# Patient Record
Sex: Female | Born: 1950 | Race: White | Hispanic: No | Smoking: Never smoker
Health system: Southern US, Community
[De-identification: ages and names within clinical notes are randomized; demographics above are authoritative.]

## PROBLEM LIST (undated history)

## (undated) DIAGNOSIS — K219 Gastro-esophageal reflux disease without esophagitis: Secondary | ICD-10-CM

## (undated) DIAGNOSIS — F32A Depression, unspecified: Secondary | ICD-10-CM

## (undated) DIAGNOSIS — F329 Major depressive disorder, single episode, unspecified: Secondary | ICD-10-CM

## (undated) DIAGNOSIS — F419 Anxiety disorder, unspecified: Secondary | ICD-10-CM

## (undated) DIAGNOSIS — R112 Nausea with vomiting, unspecified: Secondary | ICD-10-CM

## (undated) DIAGNOSIS — I10 Essential (primary) hypertension: Secondary | ICD-10-CM

## (undated) DIAGNOSIS — Z9889 Other specified postprocedural states: Secondary | ICD-10-CM

## (undated) HISTORY — PX: NEUROMA SURGERY: SHX722

## (undated) HISTORY — PX: BREAST SURGERY: SHX581

## (undated) HISTORY — PX: EYE SURGERY: SHX253

---

## 2017-12-30 ENCOUNTER — Other Ambulatory Visit: Payer: Self-pay | Admitting: Orthopedic Surgery

## 2018-01-03 ENCOUNTER — Other Ambulatory Visit: Payer: Self-pay | Admitting: Orthopedic Surgery

## 2018-01-03 DIAGNOSIS — M19011 Primary osteoarthritis, right shoulder: Secondary | ICD-10-CM

## 2018-01-05 ENCOUNTER — Ambulatory Visit
Admission: RE | Admit: 2018-01-05 | Discharge: 2018-01-05 | Disposition: A | Discharge status: Home / Self Care | Payer: Medicare Other | Source: Ambulatory Visit | Attending: Orthopedic Surgery | Admitting: Orthopedic Surgery

## 2018-01-05 DIAGNOSIS — M19011 Primary osteoarthritis, right shoulder: Secondary | ICD-10-CM

## 2018-01-24 NOTE — Pre-Procedure Instructions (Signed)
Verdis Primenn Bilodeau  01/24/2018      [facility] [id number] - [place] - [address] [place] [id number] Phone: [phone] Fax: [phone]    Your procedure is scheduled on February 14  Report to [facility] Admitting at 0530 A.M.  Call this number if you have problems the morning of surgery:  [phone]   Remember:  Do not eat food or drink liquids after midnight.  Continue all medications as directed by your physician except follow these medication instructions before surgery below   Take these medicines the morning of surgery with A SIP OF WATER  amLODipine (NORVASC) atenolol (TENORMIN) buPROPion (WELLBUTRIN  busPIRone (BUSPAR) cetirizine (ZYRTEC) MYRBETRIQ omeprazole (PRILOSEC) traMADol-acetaminophen (ULTRACET)  7 days prior to surgery STOP taking any Aspirin(unless otherwise instructed by your surgeon), Aleve, Naproxen, Ibuprofen, Motrin, Advil, Goody's, BC's, all herbal medications, fish oil, and all vitamins     Do not wear jewelry, make-up or nail polish.  Do not wear lotions, powders, or perfumes, or deodorant.  Do not shave 48 hours prior to surgery.    Do not bring valuables to the hospital.  [facility] is not responsible for any belongings or valuables.  Contacts, dentures or bridgework may not be worn into surgery.  Leave your suitcase in the car.  After surgery it may be brought to your room.  For patients admitted to the hospital, discharge time will be determined by your treatment team.  Patients discharged the day of surgery will not be allowed to drive home.    Special instructions:   [place]- Preparing For Surgery  Before surgery, you can play an important role. Because skin is not sterile, your skin needs to be as free of germs as possible. You can reduce the number of germs on your skin by washing with CHG (chlorahexidine gluconate) Soap before surgery.  CHG is an  antiseptic cleaner which kills germs and bonds with the skin to continue killing germs even after washing.  Please do not use if you have an allergy to CHG or antibacterial soaps. If your skin becomes reddened/irritated stop using the CHG.  Do not shave (including legs and underarms) for at least 48 hours prior to first CHG shower. It is OK to shave your face.  Please follow these instructions carefully.   1. Shower the NIGHT BEFORE SURGERY and the MORNING OF SURGERY with CHG.   2. If you chose to wash your hair, wash your hair first as usual with your normal shampoo.  3. After you shampoo, rinse your hair and body thoroughly to remove the shampoo.  4. Use CHG as you would any other liquid soap. You can apply CHG directly to the skin and wash gently with a scrungie or a clean washcloth.   5. Apply the CHG Soap to your body ONLY FROM THE NECK DOWN.  Do not use on open wounds or open sores. Avoid contact with your eyes, ears, mouth and genitals (private parts). Wash Face and genitals (private parts)  with your normal soap.  6. Wash thoroughly, paying special attention to the area where your surgery will be performed.  7. Thoroughly rinse your body with warm water from the neck down.  8. DO NOT shower/wash with your normal soap after using and rinsing off the CHG Soap.  9. Pat yourself dry with a CLEAN TOWEL.  10. Wear CLEAN PAJAMAS to bed the  night before surgery, wear comfortable clothes the morning of surgery  11. Place CLEAN SHEETS on your bed the night of your first shower and DO NOT SLEEP WITH PETS.    Day of Surgery: Do not apply any deodorants/lotions. Please wear clean clothes to the hospital/surgery center.      Please read over the following fact sheets that you were given.

## 2018-01-25 ENCOUNTER — Encounter (HOSPITAL_COMMUNITY): Payer: Self-pay | Admitting: *Deleted

## 2018-01-25 ENCOUNTER — Encounter (HOSPITAL_COMMUNITY)
Admission: RE | Admit: 2018-01-25 | Discharge: 2018-01-25 | Disposition: A | Discharge status: Home / Self Care | Payer: Medicare Other | Source: Ambulatory Visit | Attending: Orthopedic Surgery | Admitting: Orthopedic Surgery

## 2018-01-25 DIAGNOSIS — Z01818 Encounter for other preprocedural examination: Secondary | ICD-10-CM

## 2018-01-25 HISTORY — DX: Depression, unspecified: F32.A

## 2018-01-25 HISTORY — DX: Gastro-esophageal reflux disease without esophagitis: K21.9

## 2018-01-25 HISTORY — DX: Essential (primary) hypertension: I10

## 2018-01-25 HISTORY — DX: Major depressive disorder, single episode, unspecified: F32.9

## 2018-01-25 HISTORY — DX: Anxiety disorder, unspecified: F41.9

## 2018-01-25 HISTORY — DX: Nausea with vomiting, unspecified: Z98.890

## 2018-01-25 HISTORY — DX: Other specified postprocedural states: R11.2

## 2018-01-25 LAB — URINALYSIS, ROUTINE W REFLEX MICROSCOPIC
BILIRUBIN URINE: NEGATIVE
Glucose, UA: NEGATIVE mg/dL
Hgb urine dipstick: NEGATIVE
Ketones, ur: NEGATIVE mg/dL
LEUKOCYTES UA: NEGATIVE
NITRITE: NEGATIVE
PH: 7 (ref 5.0–8.0)
Protein, ur: NEGATIVE mg/dL
SPECIFIC GRAVITY, URINE: 1.005 (ref 1.005–1.030)

## 2018-01-25 LAB — COMPREHENSIVE METABOLIC PANEL
ALBUMIN: 4.1 g/dL (ref 3.5–5.0)
ALT: 25 U/L (ref 14–54)
AST: 41 U/L (ref 15–41)
Alkaline Phosphatase: 64 U/L (ref 38–126)
Anion gap: 16 — ABNORMAL HIGH (ref 5–15)
BILIRUBIN TOTAL: 1.4 mg/dL — AB (ref 0.3–1.2)
BUN: 15 mg/dL (ref 6–20)
CALCIUM: 9.5 mg/dL (ref 8.9–10.3)
CO2: 18 mmol/L — ABNORMAL LOW (ref 22–32)
CREATININE: 0.72 mg/dL (ref 0.44–1.00)
Chloride: 100 mmol/L — ABNORMAL LOW (ref 101–111)
GFR calc Af Amer: >60 mL/min (ref >60)
GLUCOSE: 134 mg/dL — AB (ref 65–99)
Potassium: 4.6 mmol/L (ref 3.5–5.1)
Sodium: 134 mmol/L — ABNORMAL LOW (ref 135–145)
TOTAL PROTEIN: 7.4 g/dL (ref 6.5–8.1)

## 2018-01-25 LAB — TYPE AND SCREEN
ABO/RH(D): A POS
ANTIBODY SCREEN: NEGATIVE

## 2018-01-25 LAB — PROTIME-INR
INR: 0.85
PROTHROMBIN TIME: 11.6 s (ref 11.4–15.2)

## 2018-01-25 LAB — ABO/RH: ABO/RH(D): A POS

## 2018-01-25 LAB — CBC WITH DIFFERENTIAL/PLATELET
BASOS ABS: 0 10*3/uL (ref 0.0–0.1)
BASOS PCT: 1 %
Eosinophils Absolute: 0.2 10*3/uL (ref 0.0–0.7)
Eosinophils Relative: 2 %
HEMATOCRIT: 39.4 % (ref 36.0–46.0)
Hemoglobin: 13.4 g/dL (ref 12.0–15.0)
LYMPHS PCT: 30 %
Lymphs Abs: 2.3 10*3/uL (ref 0.7–4.0)
MCH: 32.5 pg (ref 26.0–34.0)
MCHC: 34 g/dL (ref 30.0–36.0)
MCV: 95.6 fL (ref 78.0–100.0)
MONO ABS: 0.6 10*3/uL (ref 0.1–1.0)
Monocytes Relative: 8 %
NEUTROS ABS: 4.5 10*3/uL (ref 1.7–7.7)
NEUTROS PCT: 59 %
Platelets: 275 10*3/uL (ref 150–400)
RBC: 4.12 MIL/uL (ref 3.87–5.11)
RDW: 13.1 % (ref 11.5–15.5)
WBC: 7.6 10*3/uL (ref 4.0–10.5)

## 2018-01-25 LAB — SURGICAL PCR SCREEN
MRSA, PCR: NEGATIVE
Staphylococcus aureus: NEGATIVE

## 2018-01-25 LAB — APTT: APTT: 28 s (ref 24–36)

## 2018-01-26 ENCOUNTER — Encounter (HOSPITAL_COMMUNITY): Payer: Self-pay | Admitting: Anesthesiology

## 2018-01-26 MED ORDER — CEFAZOLIN SODIUM-DEXTROSE 2-4 GM/100ML-% IV SOLN
2.0000 g | INTRAVENOUS | Status: AC
  Administered 2018-01-27: 2 g via INTRAVENOUS
  Filled 2018-01-26 (×2): qty 100

## 2018-01-26 NOTE — Anesthesia Preprocedure Evaluation (Addendum)
Anesthesia Evaluation  Patient identified by MRN, date of birth, ID band Patient awake    Reviewed: Allergy & Precautions, NPO status , Patient's Chart, lab work & pertinent test results, reviewed documented beta blocker date and time   History of Anesthesia Complications (+) PONV and history of anesthetic complications  Airway Mallampati: III  TM Distance: >3 FB Neck ROM: Full    Dental  (+) Teeth Intact, Caps   Pulmonary neg pulmonary ROS,    Pulmonary exam normal breath sounds clear to auscultation       Cardiovascular hypertension, Pt. on home beta blockers and Pt. on medications Normal cardiovascular exam Rhythm:Regular Rate:Normal     Neuro/Psych PSYCHIATRIC DISORDERS Anxiety Depression negative neurological ROS     GI/Hepatic Neg liver ROS, GERD  Medicated and Controlled,  Endo/Other  Hyperlipidemia   Renal/GU negative Renal ROS  negative genitourinary   Musculoskeletal  (+) Arthritis , Osteoarthritis,  OA right shoulder   Abdominal   Peds  Hematology negative hematology ROS (+)   Anesthesia Other Findings   Reproductive/Obstetrics                            Anesthesia Physical Anesthesia Plan  ASA: II  Anesthesia Plan: General   Post-op Pain Management:  Regional for Post-op pain   Induction: Intravenous  PONV Risk Score and Plan: 4 or greater and Scopolamine patch - Pre-op, Ondansetron, Dexamethasone and Treatment may vary due to age or medical condition  Airway Management Planned: Oral ETT  Additional Equipment:   Intra-op Plan:   Post-operative Plan: Extubation in OR  Informed Consent: I have reviewed the patients History and Physical, chart, labs and discussed the procedure including the risks, benefits and alternatives for the proposed anesthesia with the patient or authorized representative who has indicated his/her understanding and acceptance.   Dental  advisory given  Plan Discussed with: CRNA, Anesthesiologist and Surgeon  Anesthesia Plan Comments:        Anesthesia Quick Evaluation

## 2018-01-27 ENCOUNTER — Inpatient Hospital Stay (HOSPITAL_COMMUNITY): Payer: Medicare Other | Admitting: Anesthesiology

## 2018-01-27 ENCOUNTER — Encounter (HOSPITAL_COMMUNITY)
Admission: RE | Disposition: A | Discharge status: Home / Self Care | Payer: Self-pay | Source: Ambulatory Visit | Attending: Orthopedic Surgery

## 2018-01-27 ENCOUNTER — Other Ambulatory Visit: Payer: Self-pay

## 2018-01-27 ENCOUNTER — Encounter (HOSPITAL_COMMUNITY): Payer: Self-pay | Admitting: Certified Registered"

## 2018-01-27 ENCOUNTER — Inpatient Hospital Stay (HOSPITAL_COMMUNITY): Payer: Medicare Other

## 2018-01-27 ENCOUNTER — Inpatient Hospital Stay (HOSPITAL_COMMUNITY): Payer: Medicare Other | Admitting: Vascular Surgery

## 2018-01-27 ENCOUNTER — Inpatient Hospital Stay (HOSPITAL_COMMUNITY)
Admission: RE | Admit: 2018-01-27 | Discharge: 2018-01-28 | DRG: 483 | Disposition: A | Discharge status: Home / Self Care | Payer: Medicare Other | Source: Ambulatory Visit | Attending: Orthopedic Surgery | Admitting: Orthopedic Surgery

## 2018-01-27 DIAGNOSIS — Z888 Allergy status to other drugs, medicaments and biological substances status: Secondary | ICD-10-CM

## 2018-01-27 DIAGNOSIS — F419 Anxiety disorder, unspecified: Secondary | ICD-10-CM | POA: Diagnosis present

## 2018-01-27 DIAGNOSIS — Z7951 Long term (current) use of inhaled steroids: Secondary | ICD-10-CM | POA: Diagnosis not present

## 2018-01-27 DIAGNOSIS — I10 Essential (primary) hypertension: Secondary | ICD-10-CM | POA: Diagnosis present

## 2018-01-27 DIAGNOSIS — K219 Gastro-esophageal reflux disease without esophagitis: Secondary | ICD-10-CM | POA: Diagnosis present

## 2018-01-27 DIAGNOSIS — Z881 Allergy status to other antibiotic agents status: Secondary | ICD-10-CM

## 2018-01-27 DIAGNOSIS — Z96611 Presence of right artificial shoulder joint: Secondary | ICD-10-CM

## 2018-01-27 DIAGNOSIS — M19011 Primary osteoarthritis, right shoulder: Principal | ICD-10-CM | POA: Diagnosis present

## 2018-01-27 DIAGNOSIS — D62 Acute posthemorrhagic anemia: Secondary | ICD-10-CM | POA: Diagnosis not present

## 2018-01-27 DIAGNOSIS — F329 Major depressive disorder, single episode, unspecified: Secondary | ICD-10-CM | POA: Diagnosis present

## 2018-01-27 DIAGNOSIS — Z882 Allergy status to sulfonamides status: Secondary | ICD-10-CM

## 2018-01-27 DIAGNOSIS — Z91048 Other nonmedicinal substance allergy status: Secondary | ICD-10-CM

## 2018-01-27 DIAGNOSIS — M25511 Pain in right shoulder: Secondary | ICD-10-CM | POA: Diagnosis present

## 2018-01-27 HISTORY — PX: TOTAL SHOULDER ARTHROPLASTY: SHX126

## 2018-01-27 SURGERY — ARTHROPLASTY, SHOULDER, TOTAL
Anesthesia: General | Site: Shoulder | Laterality: Right

## 2018-01-27 SURGERY — ARTHROPLASTY, SHOULDER, BILATERAL, TOTAL
Anesthesia: Choice | Laterality: Right

## 2018-01-27 MED ORDER — ONDANSETRON HCL 4 MG/2ML IJ SOLN
INTRAMUSCULAR | Status: DC | PRN
  Administered 2018-01-27: 4 mg via INTRAVENOUS

## 2018-01-27 MED ORDER — SODIUM CHLORIDE 0.9 % IR SOLN
Status: DC | PRN
  Administered 2018-01-27: 3000 mL

## 2018-01-27 MED ORDER — BUPROPION HCL ER (XL) 150 MG PO TB24
300.0000 mg | ORAL_TABLET | Freq: Every day | ORAL | Status: DC
  Administered 2018-01-28: 300 mg via ORAL
  Filled 2018-01-27: qty 2

## 2018-01-27 MED ORDER — FENTANYL CITRATE (PF) 250 MCG/5ML IJ SOLN
INTRAMUSCULAR | Status: AC
Start: 2018-01-27 — End: ?
  Filled 2018-01-27: qty 5

## 2018-01-27 MED ORDER — METOCLOPRAMIDE HCL 5 MG PO TABS: 5.0000 mg | ORAL_TABLET | Freq: Three times a day (TID) | ORAL | Status: DC | PRN

## 2018-01-27 MED ORDER — SENNOSIDES-DOCUSATE SODIUM 8.6-50 MG PO TABS: 1.0000 | ORAL_TABLET | Freq: Every evening | ORAL | Status: DC | PRN

## 2018-01-27 MED ORDER — DEXAMETHASONE SODIUM PHOSPHATE 10 MG/ML IJ SOLN: 10.0000 mg | INTRAMUSCULAR | Status: DC

## 2018-01-27 MED ORDER — SODIUM CHLORIDE 0.9 % IV SOLN
INTRAVENOUS | Status: DC
  Administered 2018-01-27 (×2): via INTRAVENOUS

## 2018-01-27 MED ORDER — ACETAMINOPHEN 650 MG RE SUPP: 650.0000 mg | RECTAL | Status: DC | PRN

## 2018-01-27 MED ORDER — ROCURONIUM BROMIDE 100 MG/10ML IV SOLN
INTRAVENOUS | Status: DC | PRN
  Administered 2018-01-27: 50 mg via INTRAVENOUS
  Administered 2018-01-27: 10 mg via INTRAVENOUS

## 2018-01-27 MED ORDER — ALUMINUM HYDROXIDE GEL 320 MG/5ML PO SUSP
15.0000 mL | ORAL | Status: DC | PRN
  Filled 2018-01-27: qty 30

## 2018-01-27 MED ORDER — LIDOCAINE 2% (20 MG/ML) 5 ML SYRINGE
INTRAMUSCULAR | Status: AC
  Filled 2018-01-27: qty 5

## 2018-01-27 MED ORDER — PHENOL 1.4 % MT LIQD: 1.0000 | OROMUCOSAL | Status: DC | PRN

## 2018-01-27 MED ORDER — PHENYLEPHRINE 40 MCG/ML (10ML) SYRINGE FOR IV PUSH (FOR BLOOD PRESSURE SUPPORT)
PREFILLED_SYRINGE | INTRAVENOUS | Status: DC | PRN
  Administered 2018-01-27: 80 ug via INTRAVENOUS

## 2018-01-27 MED ORDER — CLONIDINE HCL (ANALGESIA) 100 MCG/ML EP SOLN
150.0000 ug | Freq: Once | EPIDURAL | Status: DC
  Filled 2018-01-27: qty 10

## 2018-01-27 MED ORDER — MIDAZOLAM HCL 5 MG/5ML IJ SOLN
INTRAMUSCULAR | Status: DC | PRN
  Administered 2018-01-27 (×2): 1 mg via INTRAVENOUS

## 2018-01-27 MED ORDER — MIRABEGRON ER 25 MG PO TB24: 25.0000 mg | ORAL_TABLET | Freq: Every day | ORAL | Status: DC | PRN

## 2018-01-27 MED ORDER — MEPERIDINE HCL 50 MG/ML IJ SOLN: 6.2500 mg | INTRAMUSCULAR | Status: DC | PRN

## 2018-01-27 MED ORDER — METOCLOPRAMIDE HCL 5 MG/ML IJ SOLN: 5.0000 mg | Freq: Three times a day (TID) | INTRAMUSCULAR | Status: DC | PRN

## 2018-01-27 MED ORDER — ATENOLOL 50 MG PO TABS: 50.0000 mg | ORAL_TABLET | Freq: Every day | ORAL | Status: DC

## 2018-01-27 MED ORDER — AMLODIPINE BESYLATE 10 MG PO TABS
10.0000 mg | ORAL_TABLET | Freq: Every day | ORAL | Status: DC
  Administered 2018-01-28: 10 mg via ORAL
  Filled 2018-01-27: qty 1

## 2018-01-27 MED ORDER — OXYCODONE HCL 5 MG PO TABS
5.0000 mg | ORAL_TABLET | ORAL | Status: DC | PRN
  Administered 2018-01-27 (×2): 5 mg via ORAL
  Filled 2018-01-27 (×3): qty 1

## 2018-01-27 MED ORDER — MIDAZOLAM HCL 2 MG/2ML IJ SOLN
INTRAMUSCULAR | Status: AC
  Filled 2018-01-27: qty 2

## 2018-01-27 MED ORDER — DOCUSATE SODIUM 100 MG PO CAPS
100.0000 mg | ORAL_CAPSULE | Freq: Two times a day (BID) | ORAL | Status: DC
  Administered 2018-01-27 – 2018-01-28 (×2): 100 mg via ORAL
  Filled 2018-01-27 (×2): qty 1

## 2018-01-27 MED ORDER — SUGAMMADEX SODIUM 200 MG/2ML IV SOLN
INTRAVENOUS | Status: DC | PRN
  Administered 2018-01-27: 200 mg via INTRAVENOUS

## 2018-01-27 MED ORDER — MILRINONE LACTATE IN DEXTROSE 20-5 MG/100ML-% IV SOLN
0.1250 ug/kg/min | INTRAVENOUS | Status: DC
  Filled 2018-01-27: qty 100

## 2018-01-27 MED ORDER — LIDOCAINE 2% (20 MG/ML) 5 ML SYRINGE
INTRAMUSCULAR | Status: DC | PRN
  Administered 2018-01-27: 60 mg via INTRAVENOUS

## 2018-01-27 MED ORDER — 0.9 % SODIUM CHLORIDE (POUR BTL) OPTIME
TOPICAL | Status: DC | PRN
Start: 2018-01-27 — End: 2018-01-27
  Administered 2018-01-27: 1000 mL

## 2018-01-27 MED ORDER — ZOLPIDEM TARTRATE 5 MG PO TABS
5.0000 mg | ORAL_TABLET | Freq: Every day | ORAL | Status: DC
  Administered 2018-01-27: 5 mg via ORAL
  Filled 2018-01-27 (×2): qty 1

## 2018-01-27 MED ORDER — PROPOFOL 10 MG/ML IV BOLUS
INTRAVENOUS | Status: AC
  Filled 2018-01-27: qty 20

## 2018-01-27 MED ORDER — DEXAMETHASONE SODIUM PHOSPHATE 10 MG/ML IJ SOLN
INTRAMUSCULAR | Status: AC
  Filled 2018-01-27: qty 1

## 2018-01-27 MED ORDER — DIPHENHYDRAMINE HCL 12.5 MG/5ML PO ELIX: 12.5000 mg | ORAL_SOLUTION | ORAL | Status: DC | PRN

## 2018-01-27 MED ORDER — CEPHALEXIN 500 MG PO CAPS
500.0000 mg | ORAL_CAPSULE | Freq: Two times a day (BID) | ORAL | Status: DC
  Administered 2018-01-28: 500 mg via ORAL
  Filled 2018-01-27 (×2): qty 1

## 2018-01-27 MED ORDER — ASPIRIN EC 325 MG PO TBEC
325.0000 mg | DELAYED_RELEASE_TABLET | Freq: Every day | ORAL | Status: DC
  Administered 2018-01-27 – 2018-01-28 (×2): 325 mg via ORAL
  Filled 2018-01-27 (×2): qty 1

## 2018-01-27 MED ORDER — PHENYLEPHRINE 40 MCG/ML (10ML) SYRINGE FOR IV PUSH (FOR BLOOD PRESSURE SUPPORT)
PREFILLED_SYRINGE | INTRAVENOUS | Status: AC
  Filled 2018-01-27: qty 10

## 2018-01-27 MED ORDER — ONDANSETRON HCL 4 MG/2ML IJ SOLN
INTRAMUSCULAR | Status: AC
  Filled 2018-01-27: qty 2

## 2018-01-27 MED ORDER — EPHEDRINE SULFATE-NACL 50-0.9 MG/10ML-% IV SOSY
PREFILLED_SYRINGE | INTRAVENOUS | Status: DC | PRN
  Administered 2018-01-27 (×2): 5 mg via INTRAVENOUS
  Administered 2018-01-27: 10 mg via INTRAVENOUS

## 2018-01-27 MED ORDER — TRANEXAMIC ACID 1000 MG/10ML IV SOLN
1000.0000 mg | INTRAVENOUS | Status: AC
  Administered 2018-01-27: 1000 mg via INTRAVENOUS
  Filled 2018-01-27: qty 10

## 2018-01-27 MED ORDER — PHENYLEPHRINE HCL 10 MG/ML IJ SOLN
INTRAVENOUS | Status: DC | PRN
  Administered 2018-01-27: 50 ug/min via INTRAVENOUS

## 2018-01-27 MED ORDER — BUSPIRONE HCL 10 MG PO TABS
30.0000 mg | ORAL_TABLET | Freq: Two times a day (BID) | ORAL | Status: DC
  Administered 2018-01-27 – 2018-01-28 (×2): 30 mg via ORAL
  Filled 2018-01-27 (×2): qty 6

## 2018-01-27 MED ORDER — SUGAMMADEX SODIUM 200 MG/2ML IV SOLN
INTRAVENOUS | Status: AC
  Filled 2018-01-27: qty 2

## 2018-01-27 MED ORDER — DEXAMETHASONE SODIUM PHOSPHATE 10 MG/ML IJ SOLN
INTRAMUSCULAR | Status: DC | PRN
  Administered 2018-01-27: 10 mg via INTRAVENOUS
  Administered 2018-01-27: 10 mg

## 2018-01-27 MED ORDER — MENTHOL 3 MG MT LOZG: 1.0000 | LOZENGE | OROMUCOSAL | Status: DC | PRN

## 2018-01-27 MED ORDER — METOCLOPRAMIDE HCL 5 MG/ML IJ SOLN: 10.0000 mg | Freq: Once | INTRAMUSCULAR | Status: DC | PRN

## 2018-01-27 MED ORDER — ROCURONIUM BROMIDE 10 MG/ML (PF) SYRINGE
PREFILLED_SYRINGE | INTRAVENOUS | Status: AC
  Filled 2018-01-27: qty 5

## 2018-01-27 MED ORDER — ONDANSETRON HCL 4 MG PO TABS
4.0000 mg | ORAL_TABLET | Freq: Four times a day (QID) | ORAL | Status: DC | PRN
Start: 2018-01-27 — End: 2018-01-28
  Administered 2018-01-27 (×2): 4 mg via ORAL
  Filled 2018-01-27 (×2): qty 1

## 2018-01-27 MED ORDER — OXYCODONE HCL 5 MG PO TABS
10.0000 mg | ORAL_TABLET | ORAL | Status: DC | PRN
  Administered 2018-01-27 – 2018-01-28 (×6): 10 mg via ORAL
  Filled 2018-01-27 (×6): qty 2

## 2018-01-27 MED ORDER — PROPOFOL 10 MG/ML IV BOLUS
INTRAVENOUS | Status: DC | PRN
  Administered 2018-01-27: 120 mg via INTRAVENOUS

## 2018-01-27 MED ORDER — BISACODYL 5 MG PO TBEC: 5.0000 mg | DELAYED_RELEASE_TABLET | Freq: Every day | ORAL | Status: DC | PRN

## 2018-01-27 MED ORDER — FENTANYL CITRATE (PF) 100 MCG/2ML IJ SOLN
INTRAMUSCULAR | Status: DC | PRN
  Administered 2018-01-27: 50 ug via INTRAVENOUS

## 2018-01-27 MED ORDER — TRIAMTERENE-HCTZ 37.5-25 MG PO CAPS
1.0000 | ORAL_CAPSULE | Freq: Every day | ORAL | Status: DC
  Administered 2018-01-27 – 2018-01-28 (×2): 1 via ORAL
  Filled 2018-01-27 (×2): qty 1

## 2018-01-27 MED ORDER — MORPHINE SULFATE (PF) 2 MG/ML IV SOLN: 1.0000 mg | INTRAVENOUS | Status: DC | PRN

## 2018-01-27 MED ORDER — ONDANSETRON HCL 4 MG/2ML IJ SOLN
4.0000 mg | Freq: Four times a day (QID) | INTRAMUSCULAR | Status: DC | PRN
  Administered 2018-01-28: 4 mg via INTRAVENOUS
  Filled 2018-01-27: qty 2

## 2018-01-27 MED ORDER — FENTANYL CITRATE (PF) 100 MCG/2ML IJ SOLN
INTRAMUSCULAR | Status: AC
  Filled 2018-01-27: qty 2

## 2018-01-27 MED ORDER — POVIDONE-IODINE 7.5 % EX SOLN: Freq: Once | CUTANEOUS | Status: DC

## 2018-01-27 MED ORDER — CEFAZOLIN SODIUM-DEXTROSE 1-4 GM/50ML-% IV SOLN
1.0000 g | Freq: Four times a day (QID) | INTRAVENOUS | Status: AC
  Administered 2018-01-27 (×2): 1 g via INTRAVENOUS
  Filled 2018-01-27 (×3): qty 50

## 2018-01-27 MED ORDER — FENTANYL CITRATE (PF) 100 MCG/2ML IJ SOLN
25.0000 ug | INTRAMUSCULAR | Status: DC | PRN
  Administered 2018-01-27 (×2): 50 ug via INTRAVENOUS

## 2018-01-27 MED ORDER — LORATADINE 10 MG PO TABS
10.0000 mg | ORAL_TABLET | Freq: Every day | ORAL | Status: DC
  Administered 2018-01-28: 10 mg via ORAL
  Filled 2018-01-27: qty 1

## 2018-01-27 MED ORDER — SCOPOLAMINE 1 MG/3DAYS TD PT72
MEDICATED_PATCH | TRANSDERMAL | Status: AC
  Filled 2018-01-27: qty 1

## 2018-01-27 MED ORDER — PANTOPRAZOLE SODIUM 40 MG PO TBEC
80.0000 mg | DELAYED_RELEASE_TABLET | Freq: Every day | ORAL | Status: DC
  Administered 2018-01-28: 80 mg via ORAL
  Filled 2018-01-27: qty 2

## 2018-01-27 MED ORDER — LACTATED RINGERS IV SOLN
INTRAVENOUS | Status: DC | PRN
  Administered 2018-01-27: 07:00:00 via INTRAVENOUS

## 2018-01-27 MED ORDER — ACETAMINOPHEN 325 MG PO TABS
650.0000 mg | ORAL_TABLET | ORAL | Status: DC | PRN
  Filled 2018-01-27: qty 2

## 2018-01-27 MED ORDER — FLEET ENEMA 7-19 GM/118ML RE ENEM: 1.0000 | ENEMA | Freq: Once | RECTAL | Status: DC | PRN

## 2018-01-27 MED ORDER — OXYCODONE HCL 5 MG PO TABS
ORAL_TABLET | ORAL | Status: AC
  Filled 2018-01-27: qty 1

## 2018-01-27 MED ORDER — ACETAMINOPHEN 500 MG PO TABS
1000.0000 mg | ORAL_TABLET | Freq: Four times a day (QID) | ORAL | Status: AC
  Administered 2018-01-27 – 2018-01-28 (×4): 1000 mg via ORAL
  Filled 2018-01-27 (×4): qty 2

## 2018-01-27 MED ORDER — LOSARTAN POTASSIUM 50 MG PO TABS
100.0000 mg | ORAL_TABLET | Freq: Every day | ORAL | Status: DC
  Administered 2018-01-27: 100 mg via ORAL
  Filled 2018-01-27: qty 2

## 2018-01-27 MED ORDER — SCOPOLAMINE 1 MG/3DAYS TD PT72
MEDICATED_PATCH | TRANSDERMAL | Status: DC | PRN
  Administered 2018-01-27: 1 via TRANSDERMAL

## 2018-01-27 MED ORDER — ROPIVACAINE HCL 5 MG/ML IJ SOLN
INTRAMUSCULAR | Status: DC | PRN
  Administered 2018-01-27: 30 mL via PERINEURAL

## 2018-01-27 MED ORDER — ATORVASTATIN CALCIUM 80 MG PO TABS
80.0000 mg | ORAL_TABLET | Freq: Every day | ORAL | Status: DC
  Administered 2018-01-27: 80 mg via ORAL
  Filled 2018-01-27: qty 1

## 2018-01-27 SURGICAL SUPPLY — 65 items
BIT DRILL 5/64X5 DISP (BIT) ×2 IMPLANT
BLADE SAW SAG 73X25 THK (BLADE) ×1
BLADE SAW SGTL 73X25 THK (BLADE) ×1 IMPLANT
BLADE SURG 15 STRL LF DISP TIS (BLADE) ×1 IMPLANT
BLADE SURG 15 STRL SS (BLADE) ×1
CAP SHOULDER TOTAL 2 ×2 IMPLANT
CEMENT BONE DEPUY (Cement) ×2 IMPLANT
CHLORAPREP W/TINT 26ML (MISCELLANEOUS) ×2 IMPLANT
COVER SURGICAL LIGHT HANDLE (MISCELLANEOUS) ×2 IMPLANT
DRAPE INCISE IOBAN 66X45 STRL (DRAPES) ×2 IMPLANT
DRAPE ORTHO SPLIT 77X108 STRL (DRAPES) ×2
DRAPE SURG 17X23 STRL (DRAPES) ×2 IMPLANT
DRAPE SURG ORHT 6 SPLT 77X108 (DRAPES) ×2 IMPLANT
DRAPE U-SHAPE 47X51 STRL (DRAPES) ×2 IMPLANT
DRSG AQUACEL AG ADV 3.5X10 (GAUZE/BANDAGES/DRESSINGS) ×2 IMPLANT
ELECT BLADE 4.0 EZ CLEAN MEGAD (MISCELLANEOUS)
ELECT REM PT RETURN 9FT ADLT (ELECTROSURGICAL) ×2
ELECTRODE BLDE 4.0 EZ CLN MEGD (MISCELLANEOUS) IMPLANT
ELECTRODE REM PT RTRN 9FT ADLT (ELECTROSURGICAL) ×1 IMPLANT
GLOVE BIO SURGEON STRL SZ7 (GLOVE) ×2 IMPLANT
GLOVE BIO SURGEON STRL SZ7.5 (GLOVE) ×2 IMPLANT
GLOVE BIOGEL PI IND STRL 6.5 (GLOVE) ×2 IMPLANT
GLOVE BIOGEL PI IND STRL 7.0 (GLOVE) ×1 IMPLANT
GLOVE BIOGEL PI IND STRL 8 (GLOVE) ×1 IMPLANT
GLOVE BIOGEL PI INDICATOR 6.5 (GLOVE) ×2
GLOVE BIOGEL PI INDICATOR 7.0 (GLOVE) ×1
GLOVE BIOGEL PI INDICATOR 8 (GLOVE) ×1
GLOVE SURG SS PI 6.5 STRL IVOR (GLOVE) ×6 IMPLANT
GOWN STRL REUS W/ TWL LRG LVL3 (GOWN DISPOSABLE) ×1 IMPLANT
GOWN STRL REUS W/ TWL XL LVL3 (GOWN DISPOSABLE) ×1 IMPLANT
GOWN STRL REUS W/TWL LRG LVL3 (GOWN DISPOSABLE) ×1
GOWN STRL REUS W/TWL XL LVL3 (GOWN DISPOSABLE) ×1
GUIDEWIRE GLENOID 2.5X220 (WIRE) IMPLANT
HANDPIECE INTERPULSE COAX TIP (DISPOSABLE) ×1
HEMOSTAT SURGICEL 2X14 (HEMOSTASIS) ×2 IMPLANT
HOOD PEEL AWAY FLYTE STAYCOOL (MISCELLANEOUS) ×8 IMPLANT
KIT BASIN OR (CUSTOM PROCEDURE TRAY) ×2 IMPLANT
KIT ROOM TURNOVER OR (KITS) ×2 IMPLANT
MANIFOLD NEPTUNE II (INSTRUMENTS) ×2 IMPLANT
NEEDLE MAYO TROCAR (NEEDLE) ×2 IMPLANT
NS IRRIG 1000ML POUR BTL (IV SOLUTION) ×2 IMPLANT
PACK SHOULDER (CUSTOM PROCEDURE TRAY) ×2 IMPLANT
PAD ARMBOARD 7.5X6 YLW CONV (MISCELLANEOUS) ×4 IMPLANT
RESTRAINT HEAD UNIVERSAL NS (MISCELLANEOUS) ×2 IMPLANT
RETRIEVER SUT HEWSON (MISCELLANEOUS) ×2 IMPLANT
SET HNDPC FAN SPRY TIP SCT (DISPOSABLE) ×1 IMPLANT
SLING ARM FOAM STRAP LRG (SOFTGOODS) ×2 IMPLANT
SLING ARM FOAM STRAP MED (SOFTGOODS) IMPLANT
SMARTMIX MINI TOWER (MISCELLANEOUS) ×2
SPONGE LAP 18X18 X RAY DECT (DISPOSABLE) ×2 IMPLANT
SPONGE LAP 4X18 X RAY DECT (DISPOSABLE) IMPLANT
STRIP CLOSURE SKIN 1/2X4 (GAUZE/BANDAGES/DRESSINGS) ×2 IMPLANT
SUCTION FRAZIER HANDLE 10FR (MISCELLANEOUS) ×1
SUCTION TUBE FRAZIER 10FR DISP (MISCELLANEOUS) ×1 IMPLANT
SUPPORT WRAP ARM LG (MISCELLANEOUS) ×2 IMPLANT
SUT ETHIBOND NAB CT1 #1 30IN (SUTURE) ×6 IMPLANT
SUT FIBERWIRE #2 38 T-5 BLUE (SUTURE)
SUT MNCRL AB 4-0 PS2 18 (SUTURE) ×2 IMPLANT
SUT VIC AB 2-0 CT1 27 (SUTURE) ×1
SUT VIC AB 2-0 CT1 TAPERPNT 27 (SUTURE) ×1 IMPLANT
SUTURE FIBERWR #2 38 T-5 BLUE (SUTURE) IMPLANT
TAPE LABRALWHITE 1.5X36 (TAPE) ×2 IMPLANT
TAPE SUT LABRALTAP WHT/BLK (SUTURE) ×2 IMPLANT
TOWEL OR 17X26 10 PK STRL BLUE (TOWEL DISPOSABLE) ×2 IMPLANT
TOWER SMARTMIX MINI (MISCELLANEOUS) ×1 IMPLANT

## 2018-01-27 NOTE — Anesthesia Postprocedure Evaluation (Signed)
Anesthesia Post Note  Patient: Robin Kaiser  Procedure(s) Performed: TOTAL SHOULDER ARTHROPLASTY (Right Shoulder)     Patient location during evaluation: PACU Anesthesia Type: General Level of consciousness: awake and alert and oriented Pain management: pain level controlled Vital Signs Assessment: post-procedure vital signs reviewed and stable Respiratory status: spontaneous breathing, nonlabored ventilation and respiratory function stable Cardiovascular status: blood pressure returned to baseline and stable Postop Assessment: no apparent nausea or vomiting Anesthetic complications: no    Last Vitals:  Vitals:   01/27/18 0959 01/27/18 1000  BP: 109/60 110/62  Pulse: (!) 59 (!) 57  Resp: 15 11  Temp:  (!) 36.3 C  SpO2: 97% 100%    Last Pain:  Vitals:   01/27/18 1000  TempSrc:   PainSc: Asleep                 Duc Crocket A.

## 2018-01-27 NOTE — Progress Notes (Signed)
Patient called requesting Tramadol instead of oxycodone IR. MD paged

## 2018-01-27 NOTE — H&P (Signed)
Robin Kaiser is an 67 y.o. female.   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  Pain interferes with sleep and quality of life.   Past Medical History:  Diagnosis Date  . Anxiety   . Depression   . GERD (gastroesophageal reflux disease)   . Hypertension   . PONV (postoperative nausea and vomiting)     Past Surgical History:  Procedure Laterality Date  . BREAST SURGERY     reduction  . EYE SURGERY    . NEUROMA SURGERY     x3 on feet    History reviewed. No pertinent family history. Social History:  reports that  has never smoked. She does not have any smokeless tobacco history on file. She reports that she drinks alcohol. She reports that she does not use drugs.  Allergies:  Allergies  Allergen Reactions  . Fluconazole Hives  . Piroxicam Other (See Comments) and Hypertension  . Celecoxib Nausea And Vomiting  . Ciprofloxacin Nausea And Vomiting  . Rofecoxib Nausea And Vomiting  . Sulfamethoxazole-Trimethoprim Nausea And Vomiting  . Tape Rash    Medications Prior to Admission  Medication Sig Dispense Refill  . amLODipine (NORVASC) 10 MG tablet Take 10 mg by mouth daily.  0  . atenolol (TENORMIN) 50 MG tablet Take 50 mg by mouth at bedtime.  3  . atorvastatin (LIPITOR) 80 MG tablet Take 80 mg by mouth at bedtime.  3  . buPROPion (WELLBUTRIN XL) 300 MG 24 hr tablet Take 300 mg by mouth daily.    . busPIRone (BUSPAR) 30 MG tablet Take 30 mg by mouth 2 (two) times daily.  3  . Calcium Carb-Cholecalciferol (CALCIUM PLUS VITAMIN D3 PO) Take 1 tablet by mouth 2 (two) times daily.    . cephALEXin (KEFLEX) 500 MG capsule Take 500 mg by mouth 2 (two) times daily.    . cetirizine (ZYRTEC) 10 MG tablet Take 10 mg by mouth daily.  0  . Diclofenac Sodium 1.5 % SOLN Apply 1 application topically 3 (three) times daily as needed. Applied 2-3 times daily to back as needed for  pain.  0  . fluticasone (FLONASE) 50 MCG/ACT nasal spray Place 1 spray into both nostrils at bedtime.  0  . Glucosamine-Chondroitin (OSTEO BI-FLEX REGULAR STRENGTH PO) Take 2 tablets by mouth daily.    Marland Kitchen ibuprofen (ADVIL,MOTRIN) 200 MG tablet Take 600 mg by mouth every 8 (eight) hours as needed (for pain.).    . Ivermectin (SOOLANTRA) 1 % CREA Apply 1 application topically at bedtime. Applied to face for rosacea.    Marland Kitchen losartan (COZAAR) 100 MG tablet Take 100 mg by mouth at bedtime.  0  . Omega-3 Fatty Acids (FISH OIL) 1200 MG CAPS Take 1,200 mg by mouth 2 (two) times daily.    Marland Kitchen omeprazole (PRILOSEC) 40 MG capsule Take 40 mg by mouth daily before breakfast.  0  . PREMARIN vaginal cream Place 1 applicator vaginally 2 (two) times a week.  0  . traMADol-acetaminophen (ULTRACET) 37.5-325 MG tablet Take 1-2 tablets by mouth 3 (three) times daily as needed for pain.  0  . triamterene-hydrochlorothiazide (DYAZIDE) 37.5-25 MG capsule Take 1 capsule by mouth daily.  3  . zolpidem (AMBIEN) 5 MG tablet Take 5 mg by mouth at bedtime.    Marland Kitchen MYRBETRIQ 25 MG TB24 tablet Take 25 mg by mouth daily as needed. For bladder control  5  . Propylene Glycol  0.6 % SOLN Place 1 drop into both eyes at bedtime.      Results for orders placed or performed during the hospital encounter of 01/25/18 (from the past 48 hour(s))  Urinalysis, Routine w reflex microscopic     Status: Abnormal   Collection Time: 01/25/18  2:14 PM  Result Value Ref Range   Color, Urine STRAW (A) YELLOW   APPearance CLEAR CLEAR   Specific Gravity, Urine 1.005 1.005 - 1.030   pH 7.0 5.0 - 8.0   Glucose, UA NEGATIVE NEGATIVE mg/dL   Hgb urine dipstick NEGATIVE NEGATIVE   Bilirubin Urine NEGATIVE NEGATIVE   Ketones, ur NEGATIVE NEGATIVE mg/dL   Protein, ur NEGATIVE NEGATIVE mg/dL   Nitrite NEGATIVE NEGATIVE   Leukocytes, UA NEGATIVE NEGATIVE    Comment: Performed at [facility] [address], [place], [place] [address]  Surgical pcr  screen     Status: None   Collection Time: 01/25/18  2:14 PM  Result Value Ref Range   MRSA, PCR NEGATIVE NEGATIVE   Staphylococcus aureus NEGATIVE NEGATIVE    Comment: (NOTE) The Xpert SA Assay (FDA approved for NASAL specimens in patients 34 years of age and older), is one component of a comprehensive surveillance program. It is not intended to diagnose infection nor to guide or monitor treatment. Performed at [facility], [place] [address], [place], [place] [address]   APTT     Status: None   Collection Time: 01/25/18  2:15 PM  Result Value Ref Range   aPTT 28 24 - 36 seconds    Comment: Performed at [facility] [address], [place], [place] [address]  CBC WITH DIFFERENTIAL     Status: None   Collection Time: 01/25/18  2:15 PM  Result Value Ref Range   WBC 7.6 4.0 - 10.5 K/uL   RBC 4.12 3.87 - 5.11 MIL/uL   Hemoglobin 13.4 12.0 - 15.0 g/dL   HCT 39.4 36.0 - 46.0 %   MCV 95.6 78.0 - 100.0 fL   MCH 32.5 26.0 - 34.0 pg   MCHC 34.0 30.0 - 36.0 g/dL   RDW 13.1 11.5 - 15.5 %   Platelets 275 150 - 400 K/uL   Neutrophils Relative % 59 %   Neutro Abs 4.5 1.7 - 7.7 K/uL   Lymphocytes Relative 30 %   Lymphs Abs 2.3 0.7 - 4.0 K/uL   Monocytes Relative 8 %   Monocytes Absolute 0.6 0.1 - 1.0 K/uL   Eosinophils Relative 2 %   Eosinophils Absolute 0.2 0.0 - 0.7 K/uL   Basophils Relative 1 %   Basophils Absolute 0.0 0.0 - 0.1 K/uL    Comment: Performed at [facility], [place] [address], [place], [place] [address]  Comprehensive metabolic panel     Status: Abnormal   Collection Time: 01/25/18  2:15 PM  Result Value Ref Range   Sodium 134 (L) 135 - 145 mmol/L   Potassium 4.6 3.5 - 5.1 mmol/L    Comment: SLIGHT HEMOLYSIS   Chloride 100 (L) 101 - 111 mmol/L   CO2 18 (L) 22 - 32 mmol/L   Glucose, Bld 134 (H) 65 - 99 mg/dL   BUN 15 6 - 20 mg/dL   Creatinine, Ser 0.72 0.44 - 1.00 mg/dL   Calcium 9.5 8.9 - 10.3 mg/dL   Total Protein 7.4 6.5 - 8.1 g/dL   Albumin 4.1  3.5 - 5.0 g/dL   AST 41 15 - 41 U/L  ALT 25 14 - 54 U/L   Alkaline Phosphatase 64 38 - 126 U/L   Total Bilirubin 1.4 (H) 0.3 - 1.2 mg/dL   GFR calc non Af Amer >60 >60 mL/min   GFR calc Af Amer >60 >60 mL/min    Comment: (NOTE) The eGFR has been calculated using the CKD EPI equation. This calculation has not been validated in all clinical situations. eGFR's persistently <60 mL/min signify possible Chronic Kidney Disease.    Anion gap 16 (H) 5 - 15    Comment: Performed at [facility], [place] [address] [place], [place] [address]  Protime-INR     Status: None   Collection Time: 01/25/18  2:15 PM  Result Value Ref Range   Prothrombin Time 11.6 11.4 - 15.2 seconds   INR 0.85     Comment: Performed at [facility] [address], [place], [place] [address]  Type and screen Order type and screen if day of surgery is less than 15 days from draw of preadmission visit or order morning of surgery if day of surgery is greater than 6 days from preadmission visit.     Status: None   Collection Time: 01/25/18  2:31 PM  Result Value Ref Range   ABO/RH(D) A POS    Antibody Screen NEG    Sample Expiration 02/08/2018    Extend sample reason      NO TRANSFUSIONS OR PREGNANCY IN THE PAST 3 MONTHS Performed at [facility], [place] [address] [place], [place] [address]   ABO/Rh     Status: None   Collection Time: 01/25/18  2:31 PM  Result Value Ref Range   ABO/RH(D)      A POS Performed at [facility] [address], [place], [place] [address]    Dg Chest 2 View  Result Date: 01/25/2018 CLINICAL DATA:  Shoulder replacement.  Preoperative exam. EXAM: CHEST  2 VIEW COMPARISON:  No recent prior. FINDINGS: Mediastinum hilar structures normal. Lungs are clear. No pleural effusion or pneumothorax. Heart size normal. Degenerative changes and mild scoliosis thoracic spine. IMPRESSION: No active cardiopulmonary disease. Electronically Signed   By: Marcello Moores  Register   On: 01/25/2018  15:24    Review of Systems  All other systems reviewed and are negative.   Blood pressure 140/72, pulse (!) 52, temperature 98 F (36.7 C), temperature source Oral, resp. rate 20, weight 64.9 kg (143 lb), SpO2 100 %. Physical Exam  Constitutional: She is oriented to person, place, and time. She appears well-developed and well-nourished.  HENT:  Head: Atraumatic.  Eyes: EOM are normal.  Cardiovascular: Intact distal pulses.  Respiratory: Effort normal.  Musculoskeletal:  R shoulder pain with limited ROM. NVID  Neurological: She is alert and oriented to person, place, and time.  Skin: Skin is warm and dry.  Psychiatric: She has a normal mood and affect.     Assessment/Plan R shoulder endstage osteoarthritis Plan R TSA Risks / benefits of surgery discussed Consent on chart  NPO for OR Preop antibiotics   Isabella Stalling, MD 01/27/2018, 7:21 AM

## 2018-01-27 NOTE — Discharge Instructions (Signed)

## 2018-01-27 NOTE — Anesthesia Procedure Notes (Addendum)
Anesthesia Regional Block: Interscalene brachial plexus block   Pre-Anesthetic Checklist: ,, timeout performed, Correct Patient, Correct Site, Correct Laterality, Correct Procedure, Correct Position, site marked, Risks and benefits discussed,  Surgical consent,  Pre-op evaluation,  At surgeon's request and post-op pain management  Laterality: Right  Prep: chloraprep       Needles:  Injection technique: Single-shot  Needle Type: Echogenic Stimulator Needle     Needle Length: 9cm  Needle Gauge: 21     Additional Needles:   Procedures:,,,, ultrasound used (permanent image in chart),,,,  Narrative:  Start time: 01/27/2018 7:25 AM End time: 01/27/2018 7:30 AM Injection made incrementally with aspirations every 5 mL.  Performed by: Personally  Anesthesiologist: Mal AmabileFoster, Durrell Barajas, MD  Additional Notes: Timeout performed. Patient sedated. Relevant anatomy ID'd using US. Incremental 2-595ml injection of LA with frequent aspiration. Patient tolerated procedure well.

## 2018-01-27 NOTE — Op Note (Signed)
Procedure(s): TOTAL SHOULDER ARTHROPLASTY Procedure Note  Robin Kaiser female 67 y.o. 01/27/2018  Procedure(s) and Anesthesia Type:    * RIGHT TOTAL SHOULDER ARTHROPLASTY - Choice      RIGHT PROXIMAL LONG HEAD BICEPS TENODESIS  Surgeon(s) and Role:    Jones Broom, MD - Primary   Indications:  67 y.o. female  With endstage right shoulder arthritis. Pain and dysfunction interfered with quality of life and nonoperative treatment with activity modification, NSAIDS and injections failed.     Surgeon: Berline Lopes   Assistants: Damita Lack PA-C [facility] was present and scrubbed throughout the procedure and was essential in positioning, retraction, exposure, and closure)  Anesthesia: General endotracheal anesthesia with preoperative interscalene block given by the attending anesthesiologist    Procedure Detail  TOTAL SHOULDER ARTHROPLASTY  Findings: Tornier flex anatomic press-fit size 3 stem with a 46 head, cemented size small 30 Cortiloc glenoid.   A lesser tuberosity osteotomy was performed and repaired at the conclusion of the procedure.  Estimated Blood Loss:  200 mL         Drains: None   Blood Given: none          Specimens: none        Complications:  * No complications entered in OR log *         Disposition: PACU - hemodynamically stable.         Condition: stable    Procedure:   The patient was identified in the preoperative holding area where I personally marked the operative extremity after verifying with the patient and consent. She  was taken to the operating room where She was transferred to the   operative table.  The patient received an interscalene block in   the holding area by the attending anesthesiologist.  General anesthesia was induced   in the operating room without complication.  The patient did receive IV  Ancef prior to the commencement of the procedure.  The patient was   placed in the beach-chair position with the back  raised about 30   degrees.  The nonoperative extremity and head and neck were carefully   positioned and padded protecting against neurovascular compromise.  The   left upper extremity was then prepped and draped in the standard sterile   fashion.    The appropriate operative time-out was performed with   Anesthesia, the perioperative staff, as well as myself and we all agreed   that the right side was the correct operative site.  The patient received 1 g IV tranexamic acid at the start of the case around time of the incision. An approximately   10 cm incision was made from the tip of the coracoid to the center point of the   humerus at the level of the axilla.  Dissection was carried down sharply   through subcutaneous tissues and cephalic vein was identified and taken   laterally with the deltoid.  The pectoralis major was taken medially.  The   upper 1 cm of the pectoralis major was released from its attachment on   the humerus.  The clavipectoral fascia was incised just lateral to the   conjoined tendon.  This incision was carried up to but not into the   coracoacromial ligament.  Digital palpation was used to prove   integrity of the axillary nerve which was protected throughout the   procedure.  Musculocutaneous nerve was not palpated in the operative   field.  Conjoined tendon  was then retracted gently medially and the   deltoid laterally.  Anterior circumflex humeral vessels were clamped and   coagulated.  The soft tissues overlying the biceps was incised and this   incision was carried across the transverse humeral ligament to the base   of the coracoid.  The biceps was noted to be severely degenerated. It was released from the superior labrum. The biceps was then tenodesed to the soft tissue just above   pectoralis major and the remaining portion of the biceps superiorly was   excised.  An osteotomy was performed at the lesser tuberosity.  The capsule was then   released all the  way down to the 6 o'clock position of the humeral head.   The humeral head was then delivered with simultaneous adduction,   extension and external rotation.  All humeral osteophytes were removed   and the anatomic neck of the humerus was marked and cut free hand at   approximately 25 degrees retroversion within about 3 mm of the cuff   reflection posteriorly.  The head size was estimated to be a 46 medium   offset.  At that point, the humeral head was retracted posteriorly with   a Fukuda retractor.   Remaining portion of the capsule was released at the base of the   coracoid.  The remaining biceps anchor and the entire anterior-inferior   labrum was excised.  The posterior labrum was also excised but the   posterior capsule was not released.  The guidepin was placed bicortically with non-elevated guide.  The reamer was used to ream to concentric bone with punctate bleeding.  This gave an excellent concentric surface.  The center hole was then drilled for an anchor peg glenoid followed by the three peripheral holes and the posterior hole did exit the glenoid wall.  I then pulse irrigated these holes and dried   them with Surgicel.  The three peripheral holes were then   pressurized cemented with the exception of the posterior hole and the anchor peg glenoid was placed and impacted   with an excellent fit.  The glenoid was a 30 small component.  The proximal humerus was then again exposed taking care not to displace the glenoid.    The entry awl was used followed by sounding reamers and then sequentially broached from size 1 to 3. This was then left in place and the calcar planer was used. Trial head was placed with a 46.  With the trial implantation of the component,  there was approximately 50% posterior translation with immediate snap back to the   anatomic position.  With forward elevation, there was no tendency   towards posterior subluxation.   The trial was removed and the final implant was  prepared on a back table.  The trial was removed and the final implant was prepared on a back table.   3 small holes were drilled on the medial side of the lesser tuberosity osteotomy, through which 2 labral tapes were passed. The implant was then placed through the loop of the 2 labral tapes and impacted with an excellent press-fit. This achieved excellent anatomic reconstruction of the proximal humerus.  The joint was then copiously irrigated with pulse lavage.  The subscapularis and   lesser tuberosity osteotomy were then repaired using the 2 labral tapes previously passed in a double row fashion with horizontal mattress sutures medially brought over through bone tunnels tied over a bone bridge laterally.   One #1 Ethibond  was placed at the rotator interval just above   the lesser tuberosity. Copious irrigation was used. Skin was closed with 2-0 Vicryl sutures in the deep dermal layer and 4-0 Monocryl in a subcuticular  running fashion.  Sterile dressings were then applied including Aquacel.  The patient was placed in a sling and allowed to awaken from general anesthesia and taken to the recovery room in stable condition.      POSTOPERATIVE PLAN:  Early passive range of motion will be allowed with the goal of 0 degrees external rotation and 90 degrees forward elevation.  No internal rotation at this time.  No active motion of the arm until the lesser tuberosity heals.  The patient will likely be kept in the hospital for 1-2 days and then discharged home.

## 2018-01-27 NOTE — Anesthesia Procedure Notes (Signed)
Procedure Name: Intubation Date/Time: 01/27/2018 7:46 AM Performed by: Marny Lowensteinapozzi, Akif Weldy W, CRNA Pre-anesthesia Checklist: Patient identified, Emergency Drugs available, Suction available, Patient being monitored and Timeout performed Patient Re-evaluated:Patient Re-evaluated prior to induction Oxygen Delivery Method: Circle system utilized Preoxygenation: Pre-oxygenation with 100% oxygen Induction Type: IV induction Ventilation: Mask ventilation without difficulty Laryngoscope Size: Miller and 2 Grade View: Grade I Tube type: Oral Tube size: 7.0 mm Number of attempts: 1 Placement Confirmation: ETT inserted through vocal cords under direct vision,  positive ETCO2,  CO2 detector and breath sounds checked- equal and bilateral Secured at: 21 cm Tube secured with: Tape Dental Injury: Teeth and Oropharynx as per pre-operative assessment

## 2018-01-27 NOTE — Transfer of Care (Signed)
Immediate Anesthesia Transfer of Care Note  Patient: Robin Kaiser  Procedure(s) Performed: TOTAL SHOULDER ARTHROPLASTY (Right Shoulder)  Patient Location: PACU  Anesthesia Type:General and Regional  Level of Consciousness: awake, alert  and oriented  Airway & Oxygen Therapy: Patient Spontanous Breathing and Patient connected to face mask oxygen  Post-op Assessment: Report given to RN  Post vital signs: Reviewed and stable  Last Vitals:  Vitals:   01/27/18 0548  BP: 140/72  Pulse: (!) 52  Resp: 20  Temp: 36.7 C  SpO2: 100%    Last Pain:  Vitals:   01/27/18 0548  TempSrc: Oral         Complications: No apparent anesthesia complications

## 2018-01-28 ENCOUNTER — Encounter (HOSPITAL_COMMUNITY): Payer: Self-pay | Admitting: Orthopedic Surgery

## 2018-01-28 LAB — CBC
HEMATOCRIT: 32.4 % — AB (ref 36.0–46.0)
HEMOGLOBIN: 11 g/dL — AB (ref 12.0–15.0)
MCH: 32 pg (ref 26.0–34.0)
MCHC: 34 g/dL (ref 30.0–36.0)
MCV: 94.2 fL (ref 78.0–100.0)
Platelets: 220 10*3/uL (ref 150–400)
RBC: 3.44 MIL/uL — ABNORMAL LOW (ref 3.87–5.11)
RDW: 12.8 % (ref 11.5–15.5)
WBC: 11.4 10*3/uL — AB (ref 4.0–10.5)

## 2018-01-28 LAB — BASIC METABOLIC PANEL
ANION GAP: 10 (ref 5–15)
BUN: 6 mg/dL (ref 6–20)
CALCIUM: 8.3 mg/dL — AB (ref 8.9–10.3)
CHLORIDE: 90 mmol/L — AB (ref 101–111)
CO2: 24 mmol/L (ref 22–32)
Creatinine, Ser: 0.62 mg/dL (ref 0.44–1.00)
GFR calc non Af Amer: >60 mL/min (ref >60)
GLUCOSE: 112 mg/dL — AB (ref 65–99)
Potassium: 3.1 mmol/L — ABNORMAL LOW (ref 3.5–5.1)
Sodium: 124 mmol/L — ABNORMAL LOW (ref 135–145)

## 2018-01-28 MED ORDER — METHOCARBAMOL 500 MG PO TABS: 500.0000 mg | ORAL_TABLET | Freq: Three times a day (TID) | ORAL | 30 refills | Status: AC

## 2018-01-28 MED ORDER — POTASSIUM CHLORIDE CRYS ER 20 MEQ PO TBCR
40.0000 meq | EXTENDED_RELEASE_TABLET | Freq: Two times a day (BID) | ORAL | Status: DC
  Administered 2018-01-28: 40 meq via ORAL
  Filled 2018-01-28: qty 2

## 2018-01-28 MED ORDER — DOCUSATE SODIUM 100 MG PO CAPS: 100.0000 mg | ORAL_CAPSULE | Freq: Three times a day (TID) | ORAL | 0 refills | Status: AC | PRN

## 2018-01-28 MED ORDER — OXYCODONE-ACETAMINOPHEN 5-325 MG PO TABS: 1.0000 | ORAL_TABLET | ORAL | 0 refills | Status: AC | PRN

## 2018-01-28 NOTE — Evaluation (Signed)
Occupational Therapy Evaluation Patient Details Name: Robin Kaiser MRN: [id number] DOB: Apr 11, 1951 Today's Date: 01/28/2018    History of Present Illness 67 yo female s/p R TSA    Clinical Impression   Patient evaluated by Occupational Therapy with no further acute OT needs identified. All education has been completed and the patient has no further questions. See below for any follow-up Occupational Therapy or equipment needs. OT to sign off. Thank you for referral.      Follow Up Recommendations  No OT follow up    Equipment Recommendations  None recommended by OT    Recommendations for Other Services       Precautions / Restrictions Precautions Precautions: Shoulder Type of Shoulder Precautions: passive Precaution Comments: provided shoulder handout and educated on shoulder flexion 0-90 degrees and 0 external rotation Required Braces or Orthoses: Sling Restrictions Weight Bearing Restrictions: Yes RUE Weight Bearing: Non weight bearing      Mobility Bed Mobility Overal bed mobility: Modified Independent                Transfers Overall transfer level: Modified independent                    Balance                                           ADL either performed or assessed with clinical judgement   ADL Overall ADL's : Modified independent                                       General ADL Comments: complete don doff sling and dressed for home. education on all adls and iadls for home. pt return demo shoulder flexion     Vision Baseline Vision/History: Wears glasses Wears Glasses: At all times       Perception     Praxis      Pertinent Vitals/Pain Pain Assessment: Faces Faces Pain Scale: Hurts little more Pain Location: R UE Pain Descriptors / Indicators: Operative site guarding Pain Intervention(s): Monitored during session;Premedicated before session;Repositioned;Ice applied     Hand Dominance  Right   Extremity/Trunk Assessment Upper Extremity Assessment Upper Extremity Assessment: RUE deficits/detail RUE Deficits / Details: s/p surg   Lower Extremity Assessment Lower Extremity Assessment: Overall WFL for tasks assessed   Cervical / Trunk Assessment Cervical / Trunk Assessment: Normal   Communication Communication Communication: No difficulties   Cognition Arousal/Alertness: Awake/alert Behavior During Therapy: WFL for tasks assessed/performed Overall Cognitive Status: Within Functional Limits for tasks assessed                                     General Comments       Exercises Exercises: Shoulder Shoulder Exercises Shoulder Flexion: PROM;Right;10 reps;Supine Shoulder ABduction: (NOT ALLOWED AT ALL) Shoulder External Rotation: (to zero) Elbow Flexion: AROM;Right Wrist Flexion: AROM;Right Digit Composite Flexion: AROM;Right   Shoulder Instructions Shoulder Instructions Donning/doffing shirt without moving shoulder: Independent Method for sponge bathing under operated UE: Independent Donning/doffing sling/immobilizer: Independent Correct positioning of sling/immobilizer: Independent Pendulum exercises (written home exercise program): (na) ROM for elbow, wrist and digits of operated UE: Independent Sling wearing schedule (on at all times/off for ADL's): Independent Proper  positioning of operated UE when showering: Independent Positioning of UE while sleeping: Independent    Home Living Family/patient expects to be discharged to:: Private residence Living Arrangements: Alone Available Help at Discharge: Family;Available PRN/intermittently((A) until sunday 01/29/18) Type of Home: House             Bathroom Shower/Tub: Walk-in Soil scientistshower   Bathroom Toilet: Standard     Home Equipment: Hand held shower head;Shower seat   Additional Comments: brother present for all education      Prior Functioning/Environment Level of Independence:  Independent                 OT Problem List:        OT Treatment/Interventions:      OT Goals(Current goals can be found in the care plan section)    OT Frequency:     Barriers to D/C:            Co-evaluation              AM-PAC PT "6 Clicks" Daily Activity     Outcome Measure Help from another person eating meals?: None Help from another person taking care of personal grooming?: None Help from another person toileting, which includes using toliet, bedpan, or urinal?: None Help from another person bathing (including washing, rinsing, drying)?: None Help from another person to put on and taking off regular upper body clothing?: None Help from another person to put on and taking off regular lower body clothing?: None 6 Click Score: 24   End of Session Nurse Communication: Precautions  Activity Tolerance: Patient tolerated treatment well Patient left: in chair;with call bell/phone within reach;with family/visitor present                   Time: [id number] OT Time Calculation (min): 48 min Charges:  OT General Charges $OT Visit: 1 Visit OT Evaluation $OT Eval Moderate Complexity: 1 Mod OT Treatments $Self Care/Home Management : 23-37 mins G-Codes:      Mateo FlowJones, Brynn   OTR/L Pager: [phone] Office: [phone] .   Boone MasterJones, Clytee Heinrich B 01/28/2018, 1:08 PM

## 2018-01-28 NOTE — Progress Notes (Signed)
RN gave pt Discharge instructions, pt states understanding and is comfortable. Rn gave pain medication.

## 2018-01-28 NOTE — Progress Notes (Signed)
   PATIENT ID: Marshell GarfinkelAnn H Ahlers   1 Day Post-Op Procedure(s) (LRB): TOTAL SHOULDER ARTHROPLASTY (Right)  Subjective: Doing well, some soreness, no complaints. Feels like she can go home today.   Objective:  Vitals:   01/28/18 0021 01/28/18 0614  BP: 126/60 113/60  Pulse: 61 (!) 56  Resp: 16 16  Temp: 98.5 F (36.9 C) 98.4 F (36.9 C)  SpO2: 97% 96%     R UE dressing c/d/i Wiggles fingers, distally NVI  Labs:  Recent Labs    01/25/18 1415 01/28/18 0700  HGB 13.4 11.0*   Recent Labs    01/25/18 1415 01/28/18 0700  WBC 7.6 11.4*  RBC 4.12 3.44*  HCT 39.4 32.4*  PLT 275 220   Recent Labs    01/25/18 1415 01/28/18 0700  NA 134* 124*  K 4.6 3.1*  CL 100* 90*  CO2 18* 24  BUN 15 6  CREATININE 0.72 0.62  GLUCOSE 134* 112*  CALCIUM 9.5 8.3*    Assessment and Plan: 1 day s/p R TSA Low potassium- ordered replacement ABLA- expected po, asymptomatic OT- goal PROM to 90 FF, ER 0 D/c home when cleared by PT Fu in 2 weeks  VTE proph: ASA, scds

## 2018-01-28 NOTE — Discharge Summary (Signed)
Patient ID: Robin Garfinkelnn H Miyoshi MRN: [id number] DOB/AGE: 07-23-1951 [age] y.o.  Admit date: 01/27/2018 Discharge date: 01/28/2018  Admission Diagnoses:  Active Problems:   Status post total shoulder arthroplasty, right   Discharge Diagnoses:  Same  Past Medical History:  Diagnosis Date  . Anxiety   . Depression   . GERD (gastroesophageal reflux disease)   . Hypertension   . PONV (postoperative nausea and vomiting)     Surgeries: Procedure(s): TOTAL SHOULDER ARTHROPLASTY on 01/27/2018   Consultants:   Discharged Condition: Improved  Hospital Course: Robin Kaiser is an 67 y.o. female who was admitted 01/27/2018 for operative treatment of right end stage OA Patient has severe unremitting pain that affects sleep, daily activities, and work/hobbies. After pre-op clearance the patient was taken to the operating room on 01/27/2018 and underwent  Procedure(s): TOTAL SHOULDER ARTHROPLASTY.    Patient was given perioperative antibiotics:  Anti-infectives (From admission, onward)   Start     Dose/Rate Route Frequency Ordered Stop   01/28/18 1000  cephALEXin (KEFLEX) capsule 500 mg     500 mg Oral 2 times daily 01/27/18 1034     01/27/18 1300  ceFAZolin (ANCEF) IVPB 1 g/50 mL premix     1 g 100 mL/hr over 30 Minutes Intravenous Every 6 hours 01/27/18 1034 01/28/18 0659   01/27/18 0700  ceFAZolin (ANCEF) IVPB 2g/100 mL premix     2 g 200 mL/hr over 30 Minutes Intravenous To [facility] 01/26/18 0811 01/27/18 0817       Patient was given sequential compression devices, early ambulation, and asa to prevent DVT.  Patient benefited maximally from hospital stay and there were no complications.    Recent vital signs:  Patient Vitals for the past 24 hrs:  BP Temp Temp src Pulse Resp SpO2 Height  01/28/18 0614 113/60 98.4 F (36.9 C) Oral (!) 56 16 96 % -  01/28/18 0021 126/60 98.5 F (36.9 C) Oral 61 16 97 % -  01/27/18 2125 130/69 97.9 F (36.6 C) Oral 72 16 98 % -  01/27/18 1606 -  - - - - - 5\' 4"  (1.626 m)  01/27/18 1100 120/63 97.9 F (36.6 C) Oral 60 16 95 % -  01/27/18 1000 110/62 (!) 97.3 F (36.3 C) - (!) 57 11 100 % -  01/27/18 0959 109/60 - - (!) 59 15 97 % -  01/27/18 0945 - - - 60 14 100 % -  01/27/18 0932 (!) 113/58 97.8 F (36.6 C) - 64 14 100 % -     Recent laboratory studies:  Recent Labs    01/25/18 1415 01/28/18 0700  WBC 7.6 11.4*  HGB 13.4 11.0*  HCT 39.4 32.4*  PLT 275 220  NA 134* 124*  K 4.6 3.1*  CL 100* 90*  CO2 18* 24  BUN 15 6  CREATININE 0.72 0.62  GLUCOSE 134* 112*  INR 0.85  --   CALCIUM 9.5 8.3*     Discharge Medications:   Allergies as of 01/28/2018      Reactions   Fluconazole Hives   Piroxicam Other (See Comments), Hypertension   Celecoxib Nausea And Vomiting   Ciprofloxacin Nausea And Vomiting   Rofecoxib Nausea And Vomiting   Sulfamethoxazole-trimethoprim Nausea And Vomiting   Tape Rash      Medication List    STOP taking these medications   ibuprofen 200 MG tablet Commonly known as:  ADVIL,MOTRIN     TAKE these medications   amLODipine 10 MG tablet  Commonly known as:  NORVASC Take 10 mg by mouth daily.   atenolol 50 MG tablet Commonly known as:  TENORMIN Take 50 mg by mouth at bedtime.   atorvastatin 80 MG tablet Commonly known as:  LIPITOR Take 80 mg by mouth at bedtime.   buPROPion 300 MG 24 hr tablet Commonly known as:  WELLBUTRIN XL Take 300 mg by mouth daily.   busPIRone 30 MG tablet Commonly known as:  BUSPAR Take 30 mg by mouth 2 (two) times daily.   CALCIUM PLUS VITAMIN D3 PO Take 1 tablet by mouth 2 (two) times daily.   cephALEXin 500 MG capsule Commonly known as:  KEFLEX Take 500 mg by mouth 2 (two) times daily.   cetirizine 10 MG tablet Commonly known as:  ZYRTEC Take 10 mg by mouth daily.   Diclofenac Sodium 1.5 % Soln Apply 1 application topically 3 (three) times daily as needed. Applied 2-3 times daily to back as needed for pain.   docusate sodium 100 MG  capsule Commonly known as:  COLACE Take 1 capsule (100 mg total) by mouth 3 (three) times daily as needed.   Fish Oil 1200 MG Caps Take 1,200 mg by mouth 2 (two) times daily.   fluticasone 50 MCG/ACT nasal spray Commonly known as:  FLONASE Place 1 spray into both nostrils at bedtime.   losartan 100 MG tablet Commonly known as:  COZAAR Take 100 mg by mouth at bedtime.   methocarbamol 500 MG tablet Commonly known as:  ROBAXIN Take 1 tablet (500 mg total) by mouth 3 (three) times daily.   MYRBETRIQ 25 MG Tb24 tablet Generic drug:  mirabegron ER Take 25 mg by mouth daily as needed. For bladder control   omeprazole 40 MG capsule Commonly known as:  PRILOSEC Take 40 mg by mouth daily before breakfast.   OSTEO BI-FLEX REGULAR STRENGTH PO Take 2 tablets by mouth daily.   oxyCODONE-acetaminophen 5-325 MG tablet Commonly known as:  PERCOCET Take 1-2 tablets by mouth every 4 (four) hours as needed for severe pain.   PREMARIN vaginal cream Generic drug:  conjugated estrogens Place 1 applicator vaginally 2 (two) times a week.   Propylene Glycol 0.6 % Soln Place 1 drop into both eyes at bedtime.   SOOLANTRA 1 % Crea Generic drug:  Ivermectin Apply 1 application topically at bedtime. Applied to face for rosacea.   traMADol-acetaminophen 37.5-325 MG tablet Commonly known as:  ULTRACET Take 1-2 tablets by mouth 3 (three) times daily as needed for pain.   triamterene-hydrochlorothiazide 37.5-25 MG capsule Commonly known as:  DYAZIDE Take 1 capsule by mouth daily.   zolpidem 5 MG tablet Commonly known as:  AMBIEN Take 5 mg by mouth at bedtime.       Diagnostic Studies: Dg Chest 2 View  Result Date: 01/25/2018 CLINICAL DATA:  Shoulder replacement.  Preoperative exam. EXAM: CHEST  2 VIEW COMPARISON:  No recent prior. FINDINGS: Mediastinum hilar structures normal. Lungs are clear. No pleural effusion or pneumothorax. Heart size normal. Degenerative changes and mild scoliosis  thoracic spine. IMPRESSION: No active cardiopulmonary disease. Electronically Signed   By: Maisie Fus  Register   On: 01/25/2018 15:24   Ct Shoulder Right Wo Contrast  Result Date: 01/05/2018 CLINICAL DATA:  Chronic right shoulder pain with limited range of motion. EXAM: CT OF THE UPPER RIGHT EXTREMITY WITHOUT CONTRAST TECHNIQUE: Multidetector CT imaging of the upper right extremity was performed according to the standard protocol. COMPARISON:  None. FINDINGS: Bones/Joint/Cartilage There is severe osteoarthritis of the  glenohumeral joint with marginal osteophyte formation on the glenoid and humeral head with subcortical cysts in the humeral head and glenoid. Full-thickness cartilage loss. Joint effusion with loose bodies in the joint. There is a loose body in the distended bicipital tendon sheath. AC joint is normal. Muscles and Tendons Slight atrophy of the supraspinatus muscle. Incidental note is made of slight scarring at the right lung apex. Tiny calcified granuloma in the right upper lobe. IMPRESSION: Severe osteoarthritis of the glenohumeral joint with a joint effusion and loose bodies in the joint. Slight atrophy of the supraspinatus muscle. Electronically Signed   By: Francene Boyers M.D.   On: 01/05/2018 15:49   Dg Shoulder Right Port  Result Date: 01/27/2018 CLINICAL DATA:  Postop. EXAM: PORTABLE RIGHT SHOULDER COMPARISON:  None. FINDINGS: Patient is status post RIGHT total shoulder arthroplasty. Grossly satisfactory position and alignment. IMPRESSION: As above.  No adverse features. Electronically Signed   By: Elsie Stain M.D.   On: 01/27/2018 10:17    Disposition: Final discharge disposition not confirmed  Discharge Instructions    Call MD / Call 911   Complete by:  As directed    If you experience chest pain or shortness of breath, CALL 911 and be transported to the hospital emergency room.  If you develope a fever above 101 F, pus (white drainage) or increased drainage or redness at the  wound, or calf pain, call your surgeon's office.   Constipation Prevention   Complete by:  As directed    Drink plenty of fluids.  Prune juice may be helpful.  You may use a stool softener, such as Colace (over the counter) 100 mg twice a day.  Use MiraLax (over the counter) for constipation as needed.   Diet - low sodium heart healthy   Complete by:  As directed    Increase activity slowly as tolerated   Complete by:  As directed       Follow-up Information    Jones Broom, MD. Schedule an appointment as soon as possible for a visit in 2 weeks.   Specialty:  Orthopedic Surgery Contact information: [address] [place] [address] [phone]            Signed: Jiles Harold 01/28/2018, 8:24 AM

## 2018-07-05 IMAGING — DX DG SHOULDER 2+V PORT*R*
1 series · 1 of 1 positions shown · non-contrast
Comparison: None.

CLINICAL DATA: Postop.

EXAM:
PORTABLE RIGHT SHOULDER

[shoulder]
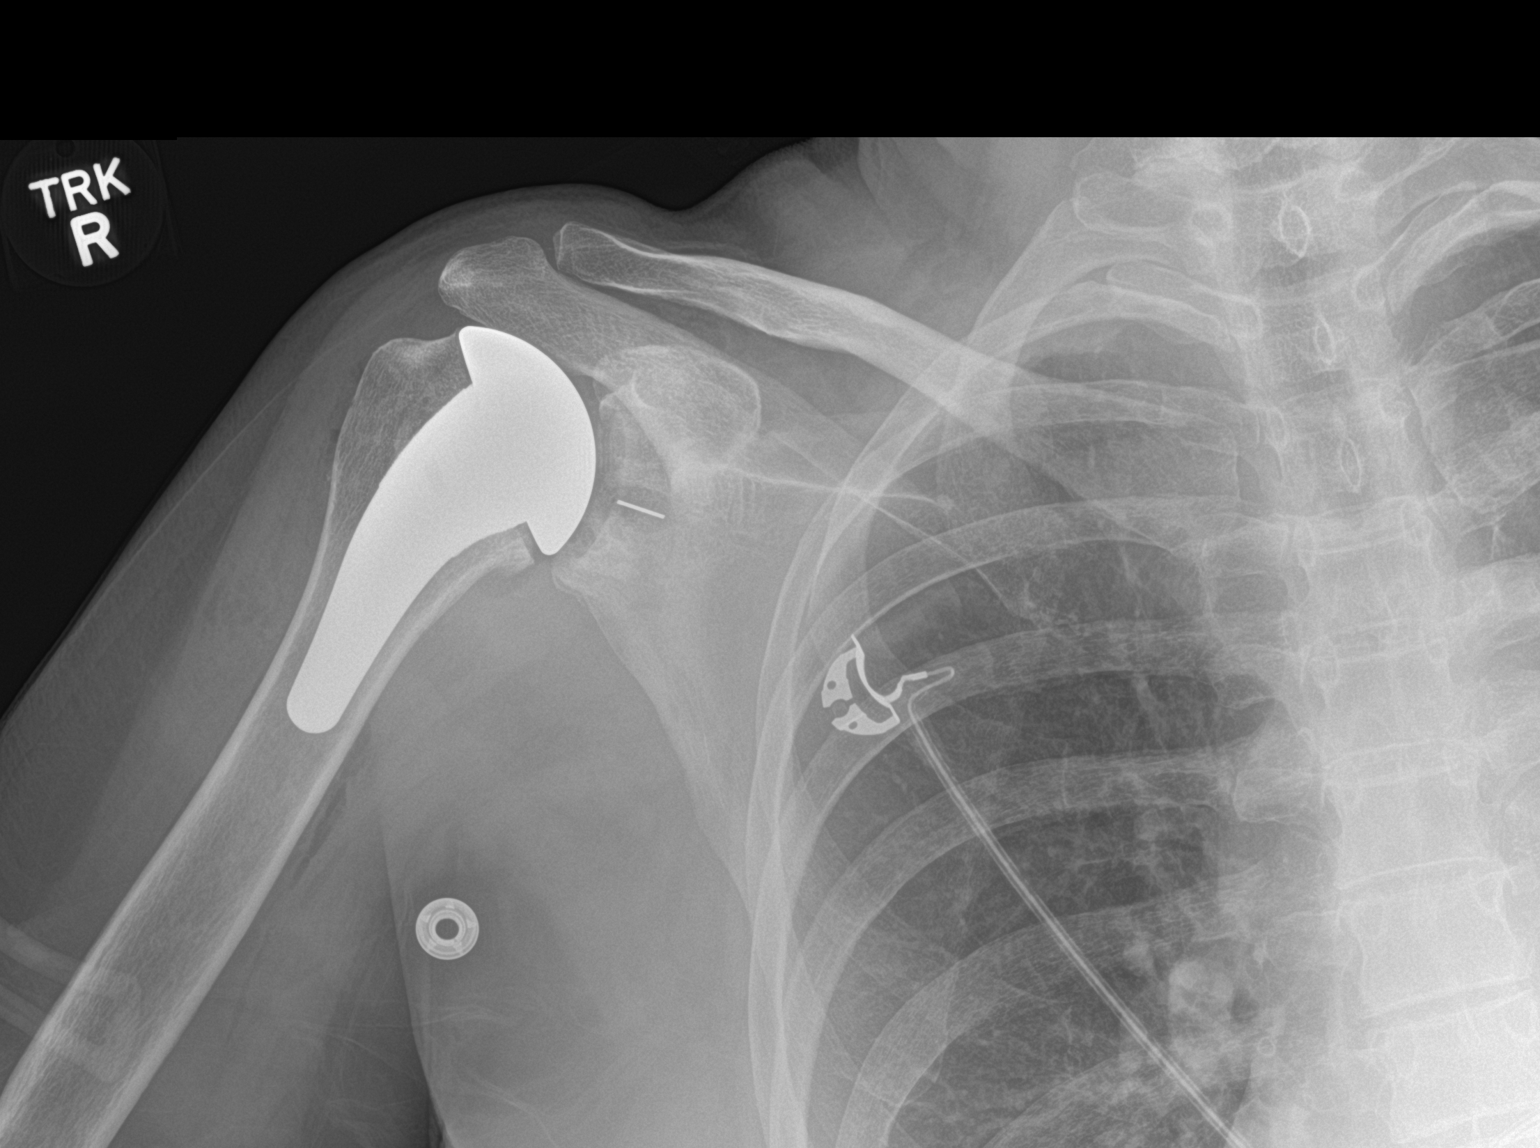

[1 of 1 positions shown; findings below may reference images not displayed]

FINDINGS: Patient is status post RIGHT total shoulder arthroplasty. Grossly
satisfactory position and alignment.
IMPRESSION: As above.  No adverse features.
